# Patient Record
Sex: Male | Born: 1960 | Hispanic: No | Marital: Married
Health system: Southern US, Community
[De-identification: ages and names within clinical notes are randomized; demographics above are authoritative.]

---

## 2009-02-06 ENCOUNTER — Emergency Department (HOSPITAL_COMMUNITY): Admission: EM | Admit: 2009-02-06 | Discharge: 2009-02-07 | Payer: Self-pay | Admitting: Emergency Medicine

## 2009-02-08 ENCOUNTER — Ambulatory Visit: Payer: Self-pay | Admitting: Internal Medicine

## 2009-02-08 DIAGNOSIS — I10 Essential (primary) hypertension: Secondary | ICD-10-CM | POA: Insufficient documentation

## 2009-02-08 DIAGNOSIS — J309 Allergic rhinitis, unspecified: Secondary | ICD-10-CM | POA: Insufficient documentation

## 2009-02-08 LAB — CONVERTED CEMR LAB
ALT: 50 units/L (ref 0–53)
AST: 43 units/L — ABNORMAL HIGH (ref 0–37)
Alkaline Phosphatase: 69 units/L (ref 39–117)
BUN: 13 mg/dL (ref 6–23)
Basophils Absolute: 0 10*3/uL (ref 0.0–0.1)
Basophils Relative: 0.6 % (ref 0.0–3.0)
Bilirubin, Direct: 0 mg/dL (ref 0.0–0.3)
CO2: 30 meq/L (ref 19–32)
Chloride: 105 meq/L (ref 96–112)
GFR calc non Af Amer: 84.75 mL/min (ref >60)
HCT: 42.8 % (ref 39.0–52.0)
Lymphocytes Relative: 26.9 % (ref 12.0–46.0)
MCHC: 33.9 g/dL (ref 30.0–36.0)
Nitrite: NEGATIVE
PSA: 0.48 ng/mL (ref 0.10–4.00)
RBC: 4.71 M/uL (ref 4.22–5.81)
Sodium: 141 meq/L (ref 135–145)
Specific Gravity, Urine: 1.01 (ref 1.000–1.030)
Total Bilirubin: 0.5 mg/dL (ref 0.3–1.2)
Total Protein, Urine: NEGATIVE mg/dL
Triglycerides: 118 mg/dL (ref 0.0–149.0)
Urine Glucose: NEGATIVE mg/dL
Urobilinogen, UA: 0.2 (ref 0.0–1.0)

## 2009-02-09 ENCOUNTER — Telehealth: Payer: Self-pay | Admitting: Internal Medicine

## 2009-02-09 ENCOUNTER — Emergency Department (HOSPITAL_COMMUNITY): Admission: EM | Admit: 2009-02-09 | Discharge: 2009-02-09 | Payer: Self-pay | Admitting: Emergency Medicine

## 2009-02-15 ENCOUNTER — Encounter (INDEPENDENT_AMBULATORY_CARE_PROVIDER_SITE_OTHER): Payer: Self-pay | Admitting: *Deleted

## 2009-02-15 ENCOUNTER — Ambulatory Visit: Payer: Self-pay | Admitting: Internal Medicine

## 2009-02-15 DIAGNOSIS — J329 Chronic sinusitis, unspecified: Secondary | ICD-10-CM | POA: Insufficient documentation

## 2009-03-01 ENCOUNTER — Ambulatory Visit: Payer: Self-pay | Admitting: Internal Medicine

## 2009-03-01 DIAGNOSIS — H538 Other visual disturbances: Secondary | ICD-10-CM

## 2009-03-04 ENCOUNTER — Encounter: Payer: Self-pay | Admitting: Internal Medicine

## 2009-03-04 ENCOUNTER — Ambulatory Visit: Payer: Self-pay | Admitting: Internal Medicine

## 2009-03-04 DIAGNOSIS — R740 Nonspecific elevation of levels of transaminase and lactic acid dehydrogenase [LDH]: Secondary | ICD-10-CM

## 2009-03-04 DIAGNOSIS — E785 Hyperlipidemia, unspecified: Secondary | ICD-10-CM

## 2009-03-04 LAB — CONVERTED CEMR LAB
AST: 45 units/L — ABNORMAL HIGH (ref 0–37)
Albumin: 3.9 g/dL (ref 3.5–5.2)
Alkaline Phosphatase: 60 units/L (ref 39–117)
Bilirubin, Direct: 0 mg/dL (ref 0.0–0.3)
Hep B Core Total Ab: POSITIVE — AB
Hepatitis B Surface Ag: NEGATIVE
Total Bilirubin: 0.5 mg/dL (ref 0.3–1.2)
Total Protein: 7.9 g/dL (ref 6.0–8.3)

## 2009-03-25 ENCOUNTER — Encounter (INDEPENDENT_AMBULATORY_CARE_PROVIDER_SITE_OTHER): Payer: Self-pay | Admitting: *Deleted

## 2009-07-06 ENCOUNTER — Telehealth (INDEPENDENT_AMBULATORY_CARE_PROVIDER_SITE_OTHER): Payer: Self-pay | Admitting: *Deleted

## 2010-06-07 NOTE — Progress Notes (Signed)
Summary: Records request from [facility] Janene Madeira.  Request for records received from [facility] Lucina Mellow, P.A. Request forwarded to [facility]. Dena Chavis  July 06, 2009 1:02 PM

## 2010-08-11 LAB — COMPREHENSIVE METABOLIC PANEL
AST: 36 U/L (ref 0–37)
BUN: 10 mg/dL (ref 6–23)
Calcium: 8.3 mg/dL — ABNORMAL LOW (ref 8.4–10.5)
Sodium: 135 mEq/L (ref 135–145)

## 2010-08-11 LAB — CBC
HCT: 42.3 % (ref 39.0–52.0)
Hemoglobin: 14.5 g/dL (ref 13.0–17.0)
MCV: 90.6 fL (ref 78.0–100.0)
Platelets: 197 10*3/uL (ref 150–400)
RBC: 4.67 MIL/uL (ref 4.22–5.81)
WBC: 8.5 10*3/uL (ref 4.0–10.5)

## 2010-08-11 LAB — URINALYSIS, ROUTINE W REFLEX MICROSCOPIC
Glucose, UA: NEGATIVE mg/dL
Leukocytes, UA: NEGATIVE
Urobilinogen, UA: 1 mg/dL (ref 0.0–1.0)

## 2010-08-11 LAB — DIFFERENTIAL
Basophils Absolute: 0 10*3/uL (ref 0.0–0.1)
Eosinophils Absolute: 0.5 10*3/uL (ref 0.0–0.7)
Eosinophils Relative: 5 % (ref 0–5)
Lymphocytes Relative: 28 % (ref 12–46)
Neutro Abs: 4.9 10*3/uL (ref 1.7–7.7)
Neutrophils Relative %: 58 % (ref 43–77)

## 2010-08-11 LAB — URINE MICROSCOPIC-ADD ON
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
## 2014-07-26 ENCOUNTER — Encounter (HOSPITAL_COMMUNITY): Payer: Self-pay

## 2014-07-26 ENCOUNTER — Emergency Department (HOSPITAL_COMMUNITY)
Admission: EM | Admit: 2014-07-26 | Discharge: 2014-07-26 | Disposition: A | Discharge status: Home / Self Care | Payer: No Typology Code available for payment source | Attending: Emergency Medicine | Admitting: Emergency Medicine

## 2014-07-26 DIAGNOSIS — Y998 Other external cause status: Secondary | ICD-10-CM | POA: Diagnosis not present

## 2014-07-26 DIAGNOSIS — S39012A Strain of muscle, fascia and tendon of lower back, initial encounter: Secondary | ICD-10-CM | POA: Diagnosis not present

## 2014-07-26 DIAGNOSIS — S299XXA Unspecified injury of thorax, initial encounter: Secondary | ICD-10-CM | POA: Diagnosis not present

## 2014-07-26 DIAGNOSIS — Y9389 Activity, other specified: Secondary | ICD-10-CM | POA: Insufficient documentation

## 2014-07-26 DIAGNOSIS — I10 Essential (primary) hypertension: Secondary | ICD-10-CM | POA: Insufficient documentation

## 2014-07-26 DIAGNOSIS — S3992XA Unspecified injury of lower back, initial encounter: Secondary | ICD-10-CM | POA: Diagnosis present

## 2014-07-26 DIAGNOSIS — Z79899 Other long term (current) drug therapy: Secondary | ICD-10-CM | POA: Insufficient documentation

## 2014-07-26 DIAGNOSIS — Y92410 Unspecified street and highway as the place of occurrence of the external cause: Secondary | ICD-10-CM | POA: Insufficient documentation

## 2014-07-26 MED ORDER — TRAMADOL HCL 50 MG PO TABS: 50.0000 mg | ORAL_TABLET | Freq: Four times a day (QID) | ORAL | Status: AC | PRN

## 2014-07-26 MED ORDER — IBUPROFEN 800 MG PO TABS
800.0000 mg | ORAL_TABLET | Freq: Once | ORAL | Status: AC
  Administered 2014-07-26: 800 mg via ORAL
  Filled 2014-07-26: qty 1

## 2014-07-26 MED ORDER — IBUPROFEN 800 MG PO TABS: 800.0000 mg | ORAL_TABLET | Freq: Three times a day (TID) | ORAL | Status: AC

## 2014-07-26 NOTE — Discharge Instructions (Signed)
Back Pain: ° ° °Your back pain should be treated with medicines such as ibuprofen or aleve and this back pain should get better over the next 2 weeks.  However if you develop severe or worsening pain, low back pain with fever, numbness, weakness or inability to walk or urinate, you should return to the ER immediately.  Please follow up with your doctor this week for a recheck if still having symptoms. °Low back pain is discomfort in the lower back that may be due to injuries to muscles and ligaments around the spine.  Occasionally, it may be caused by a a problem to a part of the spine called a disc.  The pain may last several days or a week;  However, most patients get completely well in 4 weeks. ° °Self - care:  The application of heat can help soothe the pain.  Maintaining your daily activities, including walking, is encourged, as it will help you get better faster than just staying in bed. ° °Medications are also useful to help with pain control.  A commonly prescribed medications includes acetaminophen.  This medication is generally safe, though you should not take more than 8 of the extra strength (500mg) pills a day. ° °Non steroidal anti inflammatory medications including Ibuprofen and naproxen;  These medications help both pain and swelling and are very useful in treating back pain.  They should be taken with food, as they can cause stomach upset, and more seriously, stomach bleeding.   ° °Muscle relaxants:  These medications can help with muscle tightness that is a cause of lower back pain.  Most of these medications can cause drowsiness, and it is not safe to drive or use dangerous machinery while taking them. ° °You will need to follow up with  Your primary healthcare provider in 1-2 weeks for reassessment. ° °Be aware that if you develop new symptoms, such as a fever, leg weakness, difficulty with or loss of control of your urine or bowels, abdominal pain, or more severe pain, you will need to seek  medical attention and  / or return to the Emergency department. ° °If you do not have a doctor see the list below. ° °RESOURCE GUIDE ° °Chronic Pain Problems: °Contact [facility]  [phone] °Patients need to be referred by their primary care doctor. ° °Insufficient Money for Medicine: °Contact [employer]:  call "211" or [facility] 271-5999. ° °No Primary Care Doctor: °- Call [facility]  832-8000 - can help you locate a primary care doctor that  accepts your insurance, provides certain services, etc. °- Physician Referral Service- 1-800-533-3463 ° °Agencies that provide inexpensive medical care: °- [facility]  832-8035 °- [facility]  832-7272 °- [facility]  [phone] °- [facility]  832-4777 °- [facility]  373-0678 °- [facility]  272-1050 ° °Medicaid-accepting [place] Providers: °- [facility]- [address] ° [phone], Mon-Fri 9am-7pm, Sat 9am-1pm °- [facility]- [address], [address] ° [phone] °- [facility]- [address], [address] ° [phone] °- [facility]- [address] [place] [address] ° [phone] °- [facility]- [address], [address], [phone] ° Only accepts [place] Access Medicaid patients after they have their name  applied to their card ° °Self Pay (no insurance) in [place]: °- Sickle Cell Patients: Dr Eric Dean, [facility] °   [address], [phone] °- [facility]- [address] ° [phone] °      -      [facility]- [address] °      -     [facility]- see information above (Speak to Pam H if you do not have insurance) °      -  [facility]- [address], [phone] °      -  [facility] [place]- [address],  [phone] °      -  [facility]- [address] [place] [address],  [phone] °      -  Dr Osei-Bonsu-  [address], [place], [phone] °      -  [facility]- [address], [phone] °      -  [facility]- [address] [place] [address], [phone], also [address], [phone] °      -    [facility]- [address], [phone], 1st & 3rd Saturday   every month, 10am-1pm ° °1) Find a Doctor and Pay Out of Pocket °Although you won't have to find out who is covered by your insurance plan, it is a good idea to ask around and get recommendations. You will then need to call the office and see if the doctor you have chosen will accept you as a new patient and what types of options they offer for patients who are self-pay. Some doctors offer discounts or will set up payment plans for their patients who do not have insurance, but you will need to ask so you aren't surprised when you get to your appointment. ° °2) Contact Your Local Health Department °Not all health departments have doctors that can see patients for sick visits, but many do, so it is worth a call to see if yours does. If you don't know where your local health department is, you can check in your phone book. The CDC also has a tool to help you locate your state's health department, and many state websites also have listings of all of their local health departments. ° °3) Find a Walk-in Clinic °If your illness is not likely to be very severe or complicated, you may want to try a walk in clinic. These are popping up all over the country in pharmacies, drugstores, and shopping centers. They're usually staffed by nurse practitioners or physician assistants that have been trained to treat common illnesses and complaints. They're usually fairly quick and inexpensive. However, if you have serious medical issues or chronic medical problems, these are probably not your best option ° °STD Testing °- [place] [facility] [place], [facility], [address], [place], phone [phone] or 1-877-539-9860.  Monday - Friday, call for an appointment. °- [place] [facility] [place], [facility], [address], [place], phone [phone] or 1-877-539-9860.  Monday - Friday, call for an appointment. ° °Abuse/Neglect: °- [place] Child Abuse Hotline (336) 641-3795 °- [place] Child Abuse Hotline 800-378-5315 (After Hours) ° °Emergency Shelter:  [facility] (336) 271-5985 ° °Maternity Homes: °- [facility] (336) 275-9566 °- [facility] (704) 372-4663 ° °MRSA Hotline #:   832-7006 ° °[place] Resources ° °[facility] [place]  [employer] [place] [facility] °[address]                 [address]         [address]  °[place]                                               [place]                              [place] °Phone:  349-3220                                  Phone:  342-7768                   Phone:  342-8140 ° °[facility], 342-8316 °- [place] [facility] - [facility]- 1-888-581-9988 °      -     [facility] in [place], [address],                                  336-349-4454, Insurance ° °[place] Child Abuse Hotline °(336) 342-1394 or (336) 342-3537 (After Hours) ° ° °Behavioral Health Services ° °Substance Abuse Resources: °- [facility]  336-882-2125 °- [facility] 336-784-9470 °- [facility] 336-285-9073 °- [facility] 336-845-3988 °- Residential & Outpatient Substance Abuse Program  800-659-3381 ° °Psychological Services: °- [facility]  832-9600 °- [facility]  378-7881 °- [facility], [address], [place], ACCESS LINE: 1-800-853-5163 or 336-641-4981, Http://www.[facility]/services/adult.htm ° °Dental Assistance ° °If unable to pay or  uninsured, contact:  [facility] or [place] [facility] to become qualified for the adult dental clinic. ° °Patients with Medicaid: [facility] °[address], [phone] °[address], [phone] ° °If unable to pay, or uninsured, contact [facility] (271-5999) or [place] [facility] (641-3152 in [place], 842-7733 in [place]) to become qualified for the adult dental clinic ° °Other Low-Cost Community Dental Services: °- [facility]- [address], [place], [place], [address], 723-1848, Ext. [address], 2nd and 4th Thursday of the month at 6:30am.  10 clients each day by appointment, can sometimes see walk-in patients if someone does not show for an appointment. °- [facility]- [address], [place], [place], [address], [phone] °- [facility]- [address], [place], [place], [address], [phone] °- [facility] [facility]- 342-8273 °- [facility]- 703-3100 °- [facility]- 570-6415 ° ° ° ° ° ° °
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
